# Patient Record
Sex: Male | Born: 1995 | Race: Black or African American | Hispanic: No | Marital: Single | Smoking: Never smoker
Health system: Southern US, Community
[De-identification: ages and names within clinical notes are randomized; demographics above are authoritative.]

## PROBLEM LIST (undated history)

## (undated) DIAGNOSIS — J45909 Unspecified asthma, uncomplicated: Secondary | ICD-10-CM

---
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
## 2017-07-27 ENCOUNTER — Encounter (HOSPITAL_COMMUNITY): Payer: Self-pay | Admitting: Emergency Medicine

## 2017-07-27 ENCOUNTER — Emergency Department (HOSPITAL_COMMUNITY)
Admission: EM | Admit: 2017-07-27 | Discharge: 2017-07-27 | Disposition: A | Discharge status: Home / Self Care | Attending: Emergency Medicine | Admitting: Emergency Medicine

## 2017-07-27 DIAGNOSIS — F4325 Adjustment disorder with mixed disturbance of emotions and conduct: Secondary | ICD-10-CM | POA: Diagnosis present

## 2017-07-27 DIAGNOSIS — S61502A Unspecified open wound of left wrist, initial encounter: Secondary | ICD-10-CM | POA: Diagnosis present

## 2017-07-27 DIAGNOSIS — X781XXA Intentional self-harm by knife, initial encounter: Secondary | ICD-10-CM | POA: Insufficient documentation

## 2017-07-27 DIAGNOSIS — Y939 Activity, unspecified: Secondary | ICD-10-CM | POA: Insufficient documentation

## 2017-07-27 DIAGNOSIS — Y929 Unspecified place or not applicable: Secondary | ICD-10-CM | POA: Diagnosis not present

## 2017-07-27 DIAGNOSIS — R45851 Suicidal ideations: Secondary | ICD-10-CM | POA: Insufficient documentation

## 2017-07-27 DIAGNOSIS — Y999 Unspecified external cause status: Secondary | ICD-10-CM | POA: Diagnosis not present

## 2017-07-27 LAB — COMPREHENSIVE METABOLIC PANEL
ALT: 14 U/L — AB (ref 17–63)
AST: 19 U/L (ref 15–41)
Albumin: 4.6 g/dL (ref 3.5–5.0)
Alkaline Phosphatase: 104 U/L (ref 38–126)
Anion gap: 11 (ref 5–15)
BUN: 11 mg/dL (ref 6–20)
CALCIUM: 9.7 mg/dL (ref 8.9–10.3)
CHLORIDE: 105 mmol/L (ref 101–111)
CO2: 27 mmol/L (ref 22–32)
CREATININE: 0.92 mg/dL (ref 0.61–1.24)
Glucose, Bld: 110 mg/dL — ABNORMAL HIGH (ref 65–99)
Potassium: 3.9 mmol/L (ref 3.5–5.1)
SODIUM: 143 mmol/L (ref 135–145)
Total Bilirubin: 0.7 mg/dL (ref 0.3–1.2)
Total Protein: 8.7 g/dL — ABNORMAL HIGH (ref 6.5–8.1)

## 2017-07-27 LAB — CBC
HCT: 45.8 % (ref 39.0–52.0)
HEMOGLOBIN: 15.7 g/dL (ref 13.0–17.0)
MCH: 29.8 pg (ref 26.0–34.0)
MCHC: 34.3 g/dL (ref 30.0–36.0)
MCV: 86.9 fL (ref 78.0–100.0)
Platelets: 257 10*3/uL (ref 150–400)
RBC: 5.27 MIL/uL (ref 4.22–5.81)
RDW: 12.6 % (ref 11.5–15.5)
WBC: 6.6 10*3/uL (ref 4.0–10.5)

## 2017-07-27 LAB — RAPID URINE DRUG SCREEN, HOSP PERFORMED
AMPHETAMINES: NOT DETECTED
BARBITURATES: NOT DETECTED
Benzodiazepines: NOT DETECTED
Cocaine: NOT DETECTED
OPIATES: NOT DETECTED
TETRAHYDROCANNABINOL: NOT DETECTED

## 2017-07-27 LAB — URINALYSIS, ROUTINE W REFLEX MICROSCOPIC
BILIRUBIN URINE: NEGATIVE
Glucose, UA: NEGATIVE mg/dL
Hgb urine dipstick: NEGATIVE
KETONES UR: NEGATIVE mg/dL
LEUKOCYTES UA: NEGATIVE
NITRITE: NEGATIVE
PROTEIN: NEGATIVE mg/dL
Specific Gravity, Urine: 1.018 (ref 1.005–1.030)
pH: 6 (ref 5.0–8.0)

## 2017-07-27 LAB — ETHANOL: Alcohol, Ethyl (B): <10 mg/dL (ref <10)

## 2017-07-27 LAB — ACETAMINOPHEN LEVEL: Acetaminophen (Tylenol), Serum: <10 ug/mL — ABNORMAL LOW (ref 10–30)

## 2017-07-27 LAB — SALICYLATE LEVEL

## 2017-07-27 MED ORDER — BACITRACIN ZINC 500 UNIT/GM EX OINT
TOPICAL_OINTMENT | Freq: Two times a day (BID) | CUTANEOUS | Status: DC
  Filled 2017-07-27: qty 0.9

## 2017-07-27 NOTE — ED Triage Notes (Signed)
Pt brought in by police for si with attempt to cut himself on left wrist after arguing with his parents.

## 2017-07-27 NOTE — ED Notes (Signed)
Pt experiencing depression. He is having difficulties in school and wanted to go home to spend time with family. Father had refused to pick son up. He notes from time to time, he get depressed without thinking he begin cutting his wrist. Commit to safety. He has resources available to him at hand.

## 2017-07-27 NOTE — ED Notes (Signed)
1 pt belongings bag placed in locker #42.

## 2017-07-27 NOTE — BH Assessment (Addendum)
Assessment Note  Keith Kaiser is an 22 y.o. male that presents this date voluntary with continued thoughts of self harm after cutting his left wrist. Patient is observed to have two lacerations on his medial left wrist that he reports were self inflicted with a knife after having a verbal altercation with family members earlier this date. Patient per chart review, has limited history in Epic and denies any prior attempts/gestures at self harm. Patient denies any current/past SA use, H/I or AVH. Patient does reports ongoing thoughts of self harm but denies any depression, anxiety or any other MH symptoms. Patient denies any current/past mental health history or prior MH diagnosis.  Patient does report that this incident was a suicide attempt as he voices ongoing thoughts of self harm. Patient reports that he is currently a sophomore at [employer] and lives on campus with roommates. Patient states his family resides in [place] and he was suppose to spend this weekend (Easter) with his family although after contacting his father this date Keith Kaiser [phone] patient gave consent to speak with father) his father stated he would not be able to transport patient back to [place] this weekend due to financial issues. Patient reports that he became "very upset" and the conversation quickly escalated into a argument. Patient speaks in a low soft voice and is observed to have a flat affect as he interacts with this Clinical research associate. Patient is oriented to time/place and denies any current legal issues. Patient denies any current/past history of verbal, physical or sexual abuse. Patient states after "a tough week at school" he was really looking forward to "going home" and this "was the last straw." Patient reports the incident was very spontaneous stating he "just grabbed a knife and started cutting his wrist." Patient reports after texting his parents his intentions of self harm parents contacted [facility] who  went to patient's residence and then transported him to [facility].Patient reports ongoing passive thoughts of self harm but states he "would not do it again." Case was staffed with Shaune Pollack DNP who recommended patient be monitored and observed for safety. Collateral from parents will be obtained later this date since history on this patient is limited. Parents are currently on their was from [place] to [place].         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   Past Medical History:  Past Medical History:  Diagnosis Date  . Asthma     No past surgical history on file.  Family History: No family history on file.  Social History:  reports that he has never smoked. He has never used smokeless tobacco. He reports that he does not drink alcohol or use drugs.  Additional Social History:  Alcohol / Drug Use Pain Medications: See MAR Prescriptions: See MAR Over the Counter: See MAR History of alcohol / drug use?: No history of alcohol / drug abuse Longest period of sobriety (when/how long): NA Negative Consequences of Use: (Denies) Withdrawal Symptoms: (Denies)  CIWA: CIWA-Ar BP: (!) 137/92 Pulse Rate: 75 COWS:    Allergies: No Known Allergies  Home Medications:  (Not in a hospital admission)  OB/GYN Status:  No LMP for male patient.  General Assessment Data TTS Assessment: In system Is this a Tele or Face-to-Face Assessment?: Face-to-Face Is this an Initial Assessment or a Re-assessment for this encounter?: Initial Assessment Marital status: Single Maiden name: NA Is patient pregnant?: No Pregnancy Status: No Living Arrangements: Non-relatives/Friends Can pt return to current  living arrangement?: Yes Admission Status: Voluntary Is patient capable of signing voluntary admission?: Yes Referral Source: Self/Family/Friend Insurance type: [employer]  Medical Screening Exam [facility] Walk-in ONLY) Medical Exam completed: Yes  Crisis Care  Plan Living Arrangements: Non-relatives/Friends Legal Guardian: (NA) Name of Psychiatrist: None Name of Therapist: None  Education Status Is patient currently in school?: Yes Current Grade: (2nd year college [employer]) Highest grade of school patient has completed: Freshman year [employer] Name of school: Haematologist person: NA IEP information if applicable: (NA)  Risk to self with the past 6 months Suicidal Ideation: Yes-Currently Present Has patient been a risk to self within the past 6 months prior to admission? : No Suicidal Intent: Yes-Currently Present Has patient had any suicidal intent within the past 6 months prior to admission? : No Is patient at risk for suicide?: Yes Suicidal Plan?: Yes-Currently Present Has patient had any suicidal plan within the past 6 months prior to admission? : No Specify Current Suicidal Plan: Pt had cut left wrist Access to Means: Yes Specify Access to Suicidal Means: Pt had a knife What has been your use of drugs/alcohol within the last 12 months?: Denies current use Previous Attempts/Gestures: No How many times?: 0 Other Self Harm Risks: NA Triggers for Past Attempts: Unknown Intentional Self Injurious Behavior: None Family Suicide History: No Recent stressful life event(s): Other (Comment)(School stress, Family stressors) Persecutory voices/beliefs?: No Depression: No Depression Symptoms: (Pt denies) Substance abuse history and/or treatment for substance abuse?: No Suicide prevention information given to non-admitted patients: Not applicable  Risk to Others within the past 6 months Homicidal Ideation: No Does patient have any lifetime risk of violence toward others beyond the six months prior to admission? : No Thoughts of Harm to Others: No Current Homicidal Intent: No Current Homicidal Plan: No Access to Homicidal Means: No Identified Victim: NA History of harm to others?: No Assessment of Violence: None Noted Violent Behavior  Description: NA Does patient have access to weapons?: No Criminal Charges Pending?: No Does patient have a court date: No Is patient on probation?: No  Psychosis Hallucinations: None noted Delusions: None noted  Mental Status Report Appearance/Hygiene: In scrubs Eye Contact: Good Motor Activity: Freedom of movement Speech: Soft, Slow Level of Consciousness: Alert Mood: Pleasant Affect: Anxious Anxiety Level: Moderate Thought Processes: Coherent, Relevant Judgement: Unimpaired Orientation: Person, Place, Time Obsessive Compulsive Thoughts/Behaviors: None  Cognitive Functioning Concentration: Good Memory: Recent Intact, Remote Intact Is patient IDD: No Is patient DD?: No Insight: Fair Impulse Control: Poor Appetite: Good Have you had any weight changes? : No Change Sleep: No Change Total Hours of Sleep: 7 Vegetative Symptoms: None  ADLScreening [facility] Assessment Services) Patient's cognitive ability adequate to safely complete daily activities?: Yes Patient able to express need for assistance with ADLs?: Yes Independently performs ADLs?: Yes (appropriate for developmental age)  Prior Inpatient Therapy Prior Inpatient Therapy: No  Prior Outpatient Therapy Prior Outpatient Therapy: No Does patient have an ACCT team?: No Does patient have Intensive In-House Services?  : No Does patient have [facility] services? : No Does patient have P4CC services?: No  ADL Screening (condition at time of admission) Patient's cognitive ability adequate to safely complete daily activities?: Yes Is the patient deaf or have difficulty hearing?: No Does the patient have difficulty seeing, even when wearing glasses/contacts?: No Does the patient have difficulty concentrating, remembering, or making decisions?: No Patient able to express need for assistance with ADLs?: Yes Does the patient have difficulty dressing or bathing?: No Independently performs  ADLs?: Yes (appropriate for  developmental age) Does the patient have difficulty walking or climbing stairs?: No Weakness of Legs: None Weakness of Arms/Hands: None  Home Assistive Devices/Equipment Home Assistive Devices/Equipment: None  Therapy Consults (therapy consults require a physician order) PT Evaluation Needed: No OT Evalulation Needed: No SLP Evaluation Needed: No Abuse/Neglect Assessment (Assessment to be complete while patient is alone) Physical Abuse: Denies Verbal Abuse: Denies Sexual Abuse: Denies Exploitation of patient/patient's resources: Denies Self-Neglect: Denies Values / Beliefs Cultural Requests During Hospitalization: None Spiritual Requests During Hospitalization: None Consults Spiritual Care Consult Needed: No Social Work Consult Needed: No Merchant navy officerAdvance Directives (For Healthcare) Does Patient Have a Medical Advance Directive?: No Would patient like information on creating a medical advance directive?: No - Patient declined    Additional Information 1:1 In Past 12 Months?: No CIRT Risk: No Elopement Risk: No Does patient have medical clearance?: Yes     Disposition: Case was staffed with Shaune PollackLord DNP who recommended patient be monitored and observed for safety. Collateral from parents will be obtained later this date since history on this patient is limited. [place]  Disposition Initial Assessment Completed for this Encounter: Yes Disposition of Patient: Admit Type of inpatient treatment program: Adult Patient refused recommended treatment: No Mode of transportation if patient is discharged?: (Unknown)  On Site Evaluation by:   Reviewed with Physician:    Alfredia Fergusonavid L Chadley Dziedzic 07/27/2017 2:57 PM

## 2017-07-27 NOTE — ED Notes (Signed)
Bed: [facility]3 Expected date:  Expected time:  Means of arrival:  Comments: 

## 2017-07-27 NOTE — BH Assessment (Addendum)
[facility] Assessment Progress Note Case was staffed with Shaune PollackLord DNP who recommended patient be monitored and observed for safety. Collateral from parents will be obtained later this date since history on this patient is limited. [place] are [place] way [place].

## 2017-07-27 NOTE — ED Notes (Signed)
Pt changing into scrubs at this time. 

## 2017-07-27 NOTE — ED Notes (Signed)
Patient denies pain and is resting comfortably.  

## 2017-07-27 NOTE — [facility] Suicide Risk Assessment (Signed)
Suicide Risk Assessment  Discharge Assessment   [facility]   Principal Problem: Adjustment disorder with mixed disturbance of emotions and conduct Discharge Diagnoses:  Patient Active Problem List   Diagnosis Date Noted  . 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 rate 16, height 5\' 7"  (1.702 m), weight 61.2 kg (135 lb), SpO2 100 %.Body mass index is 21.14 kg/m.  General Appearance: Casual  Eye Contact::  Good  Speech:  Normal Rate409  Volume:  Normal  Mood:  Anxious  Affect:  Congruent  Thought Process:  Coherent and Descriptions of Associations: Intact  Orientation:  Full (Time, Place, and Person)  Thought Content:  WDL and Logical  Suicidal Thoughts:  No  Homicidal Thoughts:  No  Memory:  Immediate;   Good Recent;   Good Remote;   Good  Judgement:  Fair  Insight:  Good  Psychomotor Activity:  Normal  Concentration:  Good  Recall:  Good  Fund of Knowledge:Good  Language: Good  Akathisia:  No  Handed:  Right  AIMS (if indicated):     Assets:  Housing Leisure Time Physical Health Resilience Social Support Vocational/Educational  Sleep:     Cognition: WNL  ADL's:  Intact   Mental Status Per Nursing Assessment::   On Admission:   22 yo male who presented to the ED after getting upset about his parents not coming to get him for the Easter holiday.  Impulsively scratched his wrist, superficial mark (barely visible).  Denies suicide attempt, no past history.  He regrets his actions.  No homicidal ideations, hallucinations, or substance abuse.  His parents came and his father reports no safety  concerns but he and his mother are taking him home to [place] with them.   All agreeable for him to go to his counseling services at school (information provided) to get assistance with better coping skills.  Patient contracts for safety and will call crisis numbers provided if he gets upset in the future.  Stable for discharge to parents.  Demographic Factors:  Male and Adolescent or young adult  Loss Factors: NA  Historical Factors: NA  Risk Reduction Factors:   Sense of responsibility to family, Living with another person, especially a relative and Positive social support  Continued Clinical Symptoms:  Anxiety, mild  Cognitive Features That Contribute To Risk:  None    Suicide Risk:  Minimal: No identifiable suicidal ideation.  Patients presenting with no risk factors but with morbid ruminations; may be classified as minimal risk based on the severity of the depressive symptoms    Plan Of Care/Follow-up recommendations:  Activity:  as tolerated Diet:  heart healthy diet  Theresia Pree, NP 07/27/2017, 5:09 PM

## 2017-07-27 NOTE — ED Provider Notes (Signed)
[facility]-EMERGENCY DEPT Provider Note   CSN: [id number] Arrival date & time: 07/27/17  1231     History   Chief Complaint Chief Complaint  Patient presents with  . Suicidal  . Extremity Laceration    HPI Keith Kaiser is a 22 y.o. male.  HPI   Patient is a 22 year old male who presents the ED today complaining of suicidal ideations and suicidal attempt that occurred prior to arrival.  Patient states he attempted to cut himself with a pocket knife on the left wrist.  States that he began to have thoughts of harming himself after he had an argument with his family.  Stated that he wanted to go home for Easter to see his family however due to financial issues he was not able to go home to visit them.  This made him upset which is when he tried to harm himself.  He denies any homicidal ideations or AVH.  States he has never had any attempts in the past to hurt himself.  Denies any drug or alcohol use prior to arrival or on a regular basis.  Patient has no medical complaints including no fevers, headaches, chest pain shortness of breath, abdominal pain, nausea, vomiting, diarrhea, urinary symptoms.  States that his tetanus is up-to-date.  1:20 PM Spoke with [name] who is the patients father.  He states that he was speaking with the patient about coming home for the weekend to spend time with family for Easter.  States that they were unable to have him come home this weekend due to financial issues and patient became upset they began arguing.  This led to patient feeling suicidal and hurting himself.  Father contacted police who picked the patient up from his dorm and brought him here to the ED.  Parents are currently on the way to the ED.  He states that the patient has no history of suicidal ideations or suicide attempts in the past.  No family history of psychiatric illness and no personal history of psychiatric illness and the patient.  Father denies any  knowledge of substance or alcohol use.  Past Medical History:  Diagnosis Date  . Asthma     Patient Active Problem List   Diagnosis Date Noted  . 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      Home Medications    Prior to Admission medications   Not on File    Family History No family history on file.  Social History Social History   Tobacco Use  . Smoking status: Never Smoker  . Smokeless tobacco: Never Used  Substance Use Topics  . Alcohol use: No  . Drug use: No     Allergies   Patient has no known allergies.   Review of Systems Review of Systems  Constitutional: Negative for fever.  HENT: Negative for congestion and sore throat.   Eyes: Negative for visual disturbance.  Respiratory: Negative for cough and shortness of breath.   Cardiovascular: Negative for chest pain.  Gastrointestinal: Negative for abdominal pain, constipation, diarrhea, nausea and vomiting.  Genitourinary: Negative for frequency and hematuria.  Musculoskeletal: Negative for back pain.  Skin: Positive for wound.  Neurological: Negative for headaches.     Physical Exam Updated Vital Signs BP 115/75 (BP Location: Left Arm)   Pulse 65   Temp 98 F (36.7 C) (Oral)   Resp 18   Ht 5\' 7"  (1.702 m)  Wt 61.2 kg (135 lb)   SpO2 100%   BMI 21.14 kg/m   Physical Exam  Constitutional: He appears well-developed and well-nourished.  HENT:  Head: Normocephalic and atraumatic.  Mouth/Throat: Oropharynx is clear and moist.  Eyes: Pupils are equal, round, and reactive to light. Conjunctivae and EOM are normal.  Neck: Neck supple.  Cardiovascular: Normal rate, regular rhythm and normal heart sounds.  Pulmonary/Chest: Effort normal and breath sounds normal. No respiratory distress.  Abdominal: Soft. Bowel sounds are normal. There is no tenderness.  Musculoskeletal: He exhibits no edema.  Patient has multiple  superficial wounds to the left wrist that only involve the epidermis.  Area was cleansed with tap water and there is no active bleeding at this time.  Radial pulses are intact bilaterally.  There are no sensory changes.  Bilateral upper extremities have 5/5 grip strength, flexion/extension at the wrists, flexion, extension at elbows.  Neurological: He is alert.  Skin: Skin is warm and dry. Capillary refill takes less than 2 seconds.  Psychiatric: He has a normal mood and affect.  Nursing note and vitals reviewed.    ED Treatments / Results  Labs (all labs ordered are listed, but only abnormal results are displayed) Labs Reviewed  COMPREHENSIVE METABOLIC PANEL - Abnormal; Notable for the following components:      Result Value   Glucose, Bld 110 (*)    Total Protein 8.7 (*)    ALT 14 (*)    All other components within normal limits  ACETAMINOPHEN LEVEL - Abnormal; Notable for the following components:   Acetaminophen (Tylenol), Serum <10 (*)    All other components within normal limits  ETHANOL  SALICYLATE LEVEL  CBC  RAPID URINE DRUG SCREEN, HOSP PERFORMED  URINALYSIS, ROUTINE W REFLEX MICROSCOPIC    EKG None  Radiology No results found.  Procedures Procedures (including critical care time)  Medications Ordered in ED Medications  bacitracin ointment (has no administration in time range)     Initial Impression / Assessment and Plan / ED Course  I have reviewed the triage vital signs and the nursing notes.  Pertinent labs & imaging results that were available during my care of the patient were reviewed by me and considered in my medical decision making (see chart for details).      Final Clinical Impressions(s) / ED Diagnoses   Final diagnoses:  Suicidal ideation  Open wound of left wrist, initial encounter  Adjustment disorder with mixed disturbance of emotions and conduct   Patient presented with suicidal ideations and suicidal attempt by means of cutting his  left wrist with a pocket knife.  Patient is medically stable with normal vital signs.  No active bleeding to the left wrist, wounds are superficial.  Tdap is up-to-date.  Area was cleansed with tap water, bacitracin applied, and dressing applied.  Lab work is benign.  No leukocytosis or gross electrolyte abnormalities.  UA is negative for UTI.  UDS negative for substance abuse. Patient is medically cleared for TTS evaluation.  [facility] completed assessment and patient was recommended for inpatient treatment program with plan to observe patient.  Upon rechecking patient's chart after [facility] assessment, patient had been discharged by Nanine Means, DNP.  6:04 PM Discussed case with Shaune Pollack who states she spoke with family and pt and felt that pt was safe for discharge with outpatient follow up. States that she has not completed discharge note yet and will complete the note tomorrow morning.  ED Discharge Orders  Ordered    Increase activity slowly     07/27/17 1712    Diet - low sodium heart healthy     07/27/17 1712    Discharge instructions    Comments:  Discharge to parents   07/27/17 [address]12       Javonta Gronau, Perrin SmackCortni S, PA-C 07/28/17 0038    Shaune PollackIsaacs, Cameron, MD 07/28/17 [phone]

## 2017-07-27 NOTE — [facility] Assessment (Signed)
[facility] Assessment Progress Note Lord DNP spoke with parents and patient who was able to contract for safety. After speaking with parents patient will be discharged this date in the care of his parents and follow [facility] [employer] [facility] he [facility]. Patient denies any S/I, H/I or AVH.

## 2017-07-28 NOTE — [facility] Suicide Risk Assessment (Signed)
22 yo male who presented to the ED after getting upset about his parents not coming to get him for the Easter holiday.  Impulsively scratched his wrist, superficial mark (barely visible).  Denies suicide attempt, no past history.  He regrets his actions.  No homicidal ideations, hallucinations, or substance abuse.  His parents came and his father reports no safety concerns but he and his [place].   All agreeable for him to go to his counseling services at school (information provided) to get assistance with better coping skills.  Patient contracts for safety and will call crisis numbers provided if he gets upset in the future.  Stable for discharge to parents.  Nanine MeansJamison Seryna Marek, PMHNP

## 2017-11-04 IMAGING — CR DG CHEST 2V
2 series · 2 of 2 positions shown · non-contrast
Comparison: None.

CLINICAL DATA: Initial evaluation for acute dyspnea with laying
down at night. Intermittent cough.

EXAM:
CHEST  2 VIEW

[chest pa]
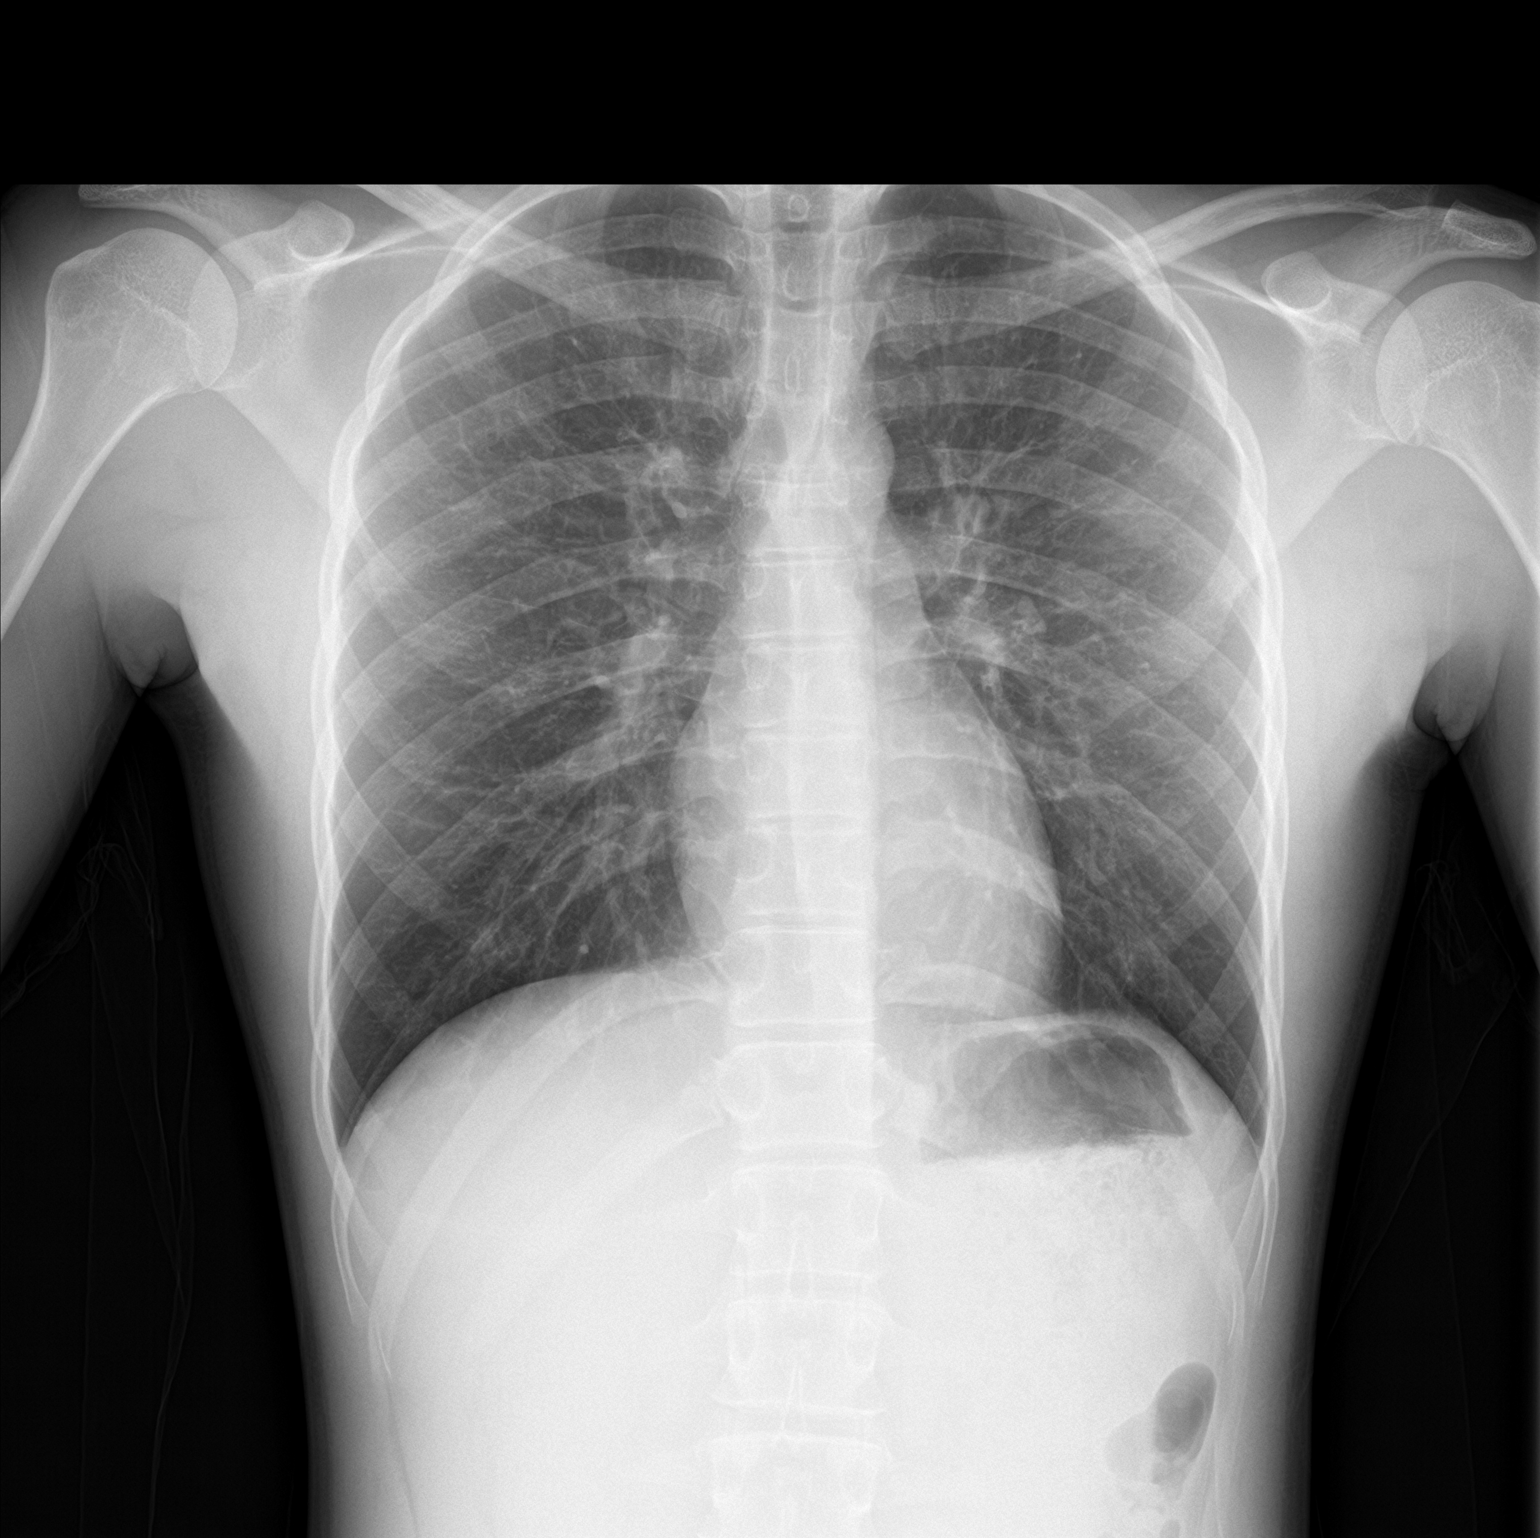

[chest lat]
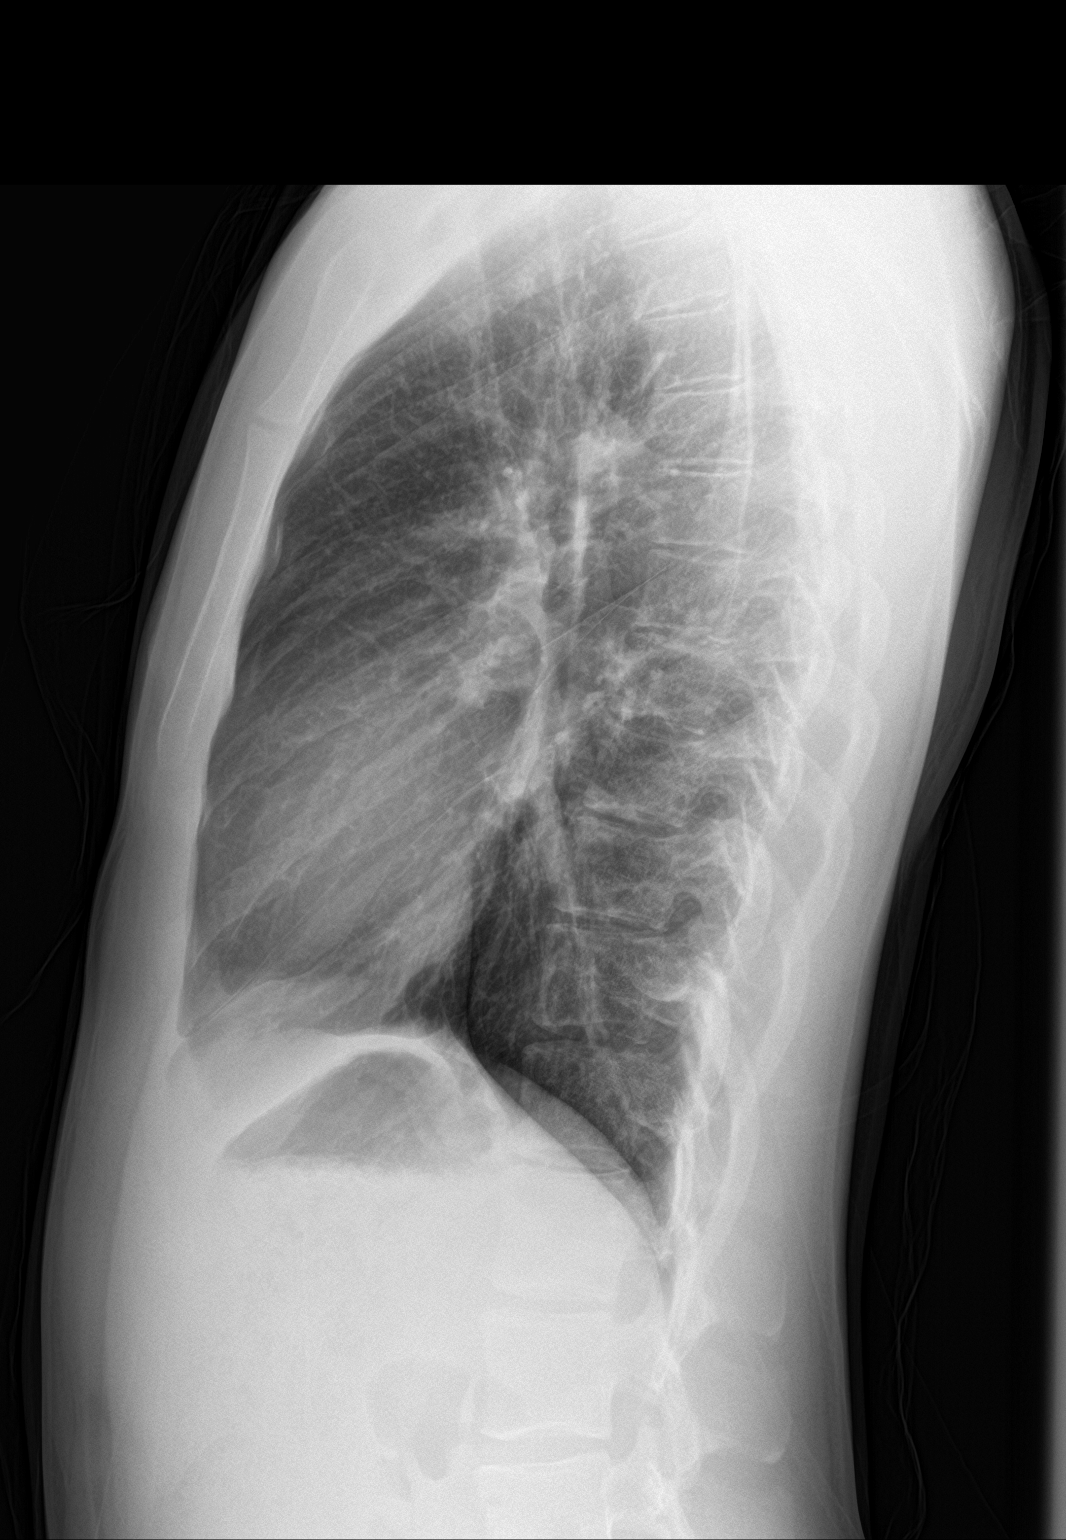

[2 of 2 positions shown; findings below may reference images not displayed]

FINDINGS: The heart size and mediastinal contours are within normal limits.
Both lungs are clear. The visualized skeletal structures are
unremarkable.
IMPRESSION: No active cardiopulmonary disease.
